# Patient Record
Sex: Female | Born: 1985 | Race: White | Hispanic: No | Marital: Single | Smoking: Never smoker
Health system: Southern US, Community
[De-identification: ages and names within clinical notes are randomized; demographics above are authoritative.]

---

## 2006-01-30 ENCOUNTER — Emergency Department (HOSPITAL_COMMUNITY): Admission: EM | Admit: 2006-01-30 | Discharge: 2006-01-30 | Payer: Self-pay | Admitting: Emergency Medicine
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
## 2008-07-07 ENCOUNTER — Emergency Department (HOSPITAL_COMMUNITY): Admission: EM | Admit: 2008-07-07 | Discharge: 2008-07-07 | Payer: Self-pay | Admitting: Emergency Medicine

## 2008-09-04 ENCOUNTER — Emergency Department (HOSPITAL_COMMUNITY): Admission: EM | Admit: 2008-09-04 | Discharge: 2008-09-04 | Payer: Self-pay | Admitting: Emergency Medicine

## 2008-09-05 ENCOUNTER — Emergency Department (HOSPITAL_COMMUNITY): Admission: EM | Admit: 2008-09-05 | Discharge: 2008-09-05 | Payer: Self-pay | Admitting: Emergency Medicine

## 2010-07-05 ENCOUNTER — Emergency Department (HOSPITAL_COMMUNITY)
Admission: EM | Admit: 2010-07-05 | Discharge: 2010-07-06 | Payer: Self-pay | Source: Home / Self Care | Admitting: Emergency Medicine

## 2010-10-14 LAB — BASIC METABOLIC PANEL
Chloride: 104 mEq/L (ref 96–112)
Creatinine, Ser: 0.7 mg/dL (ref 0.4–1.2)
GFR calc Af Amer: >60 mL/min (ref >60)
GFR calc non Af Amer: >60 mL/min (ref >60)
Glucose, Bld: 99 mg/dL (ref 70–99)

## 2010-10-14 LAB — CBC
HCT: 38.3 % (ref 36.0–46.0)
Hemoglobin: 12.8 g/dL (ref 12.0–15.0)
Platelets: 220 10*3/uL (ref 150–400)
RBC: 4.2 MIL/uL (ref 3.87–5.11)

## 2010-10-14 LAB — DIFFERENTIAL
Basophils Absolute: 0 10*3/uL (ref 0.0–0.1)
Neutro Abs: 18.7 10*3/uL — ABNORMAL HIGH (ref 1.7–7.7)

## 2010-10-14 LAB — RAPID STREP SCREEN (MED CTR MEBANE ONLY): Streptococcus, Group A Screen (Direct): POSITIVE — AB

## 2011-05-09 IMAGING — CR DG FOOT COMPLETE 3+V*R*
3 series · 3 of 3 positions shown · non-contrast
Comparison: None.

CLINICAL DATA: Splinter in heel, pain and swelling

RIGHT FOOT COMPLETE - 3+ VIEW

[t foot ap right]
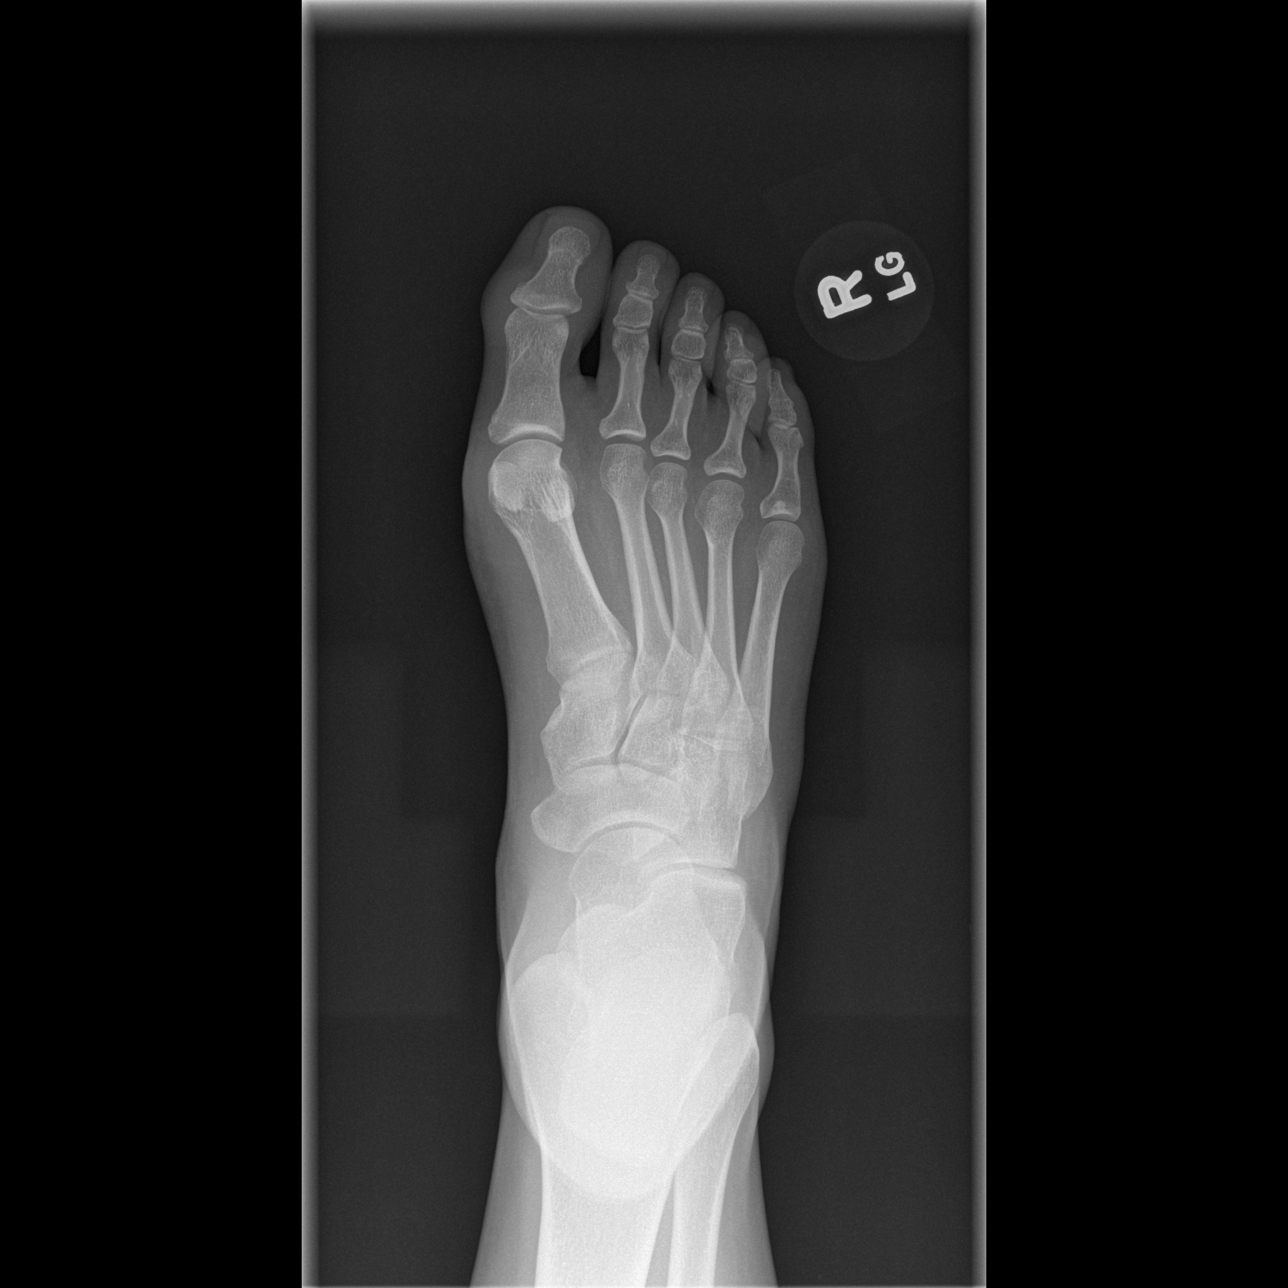

[t foot oblique right]
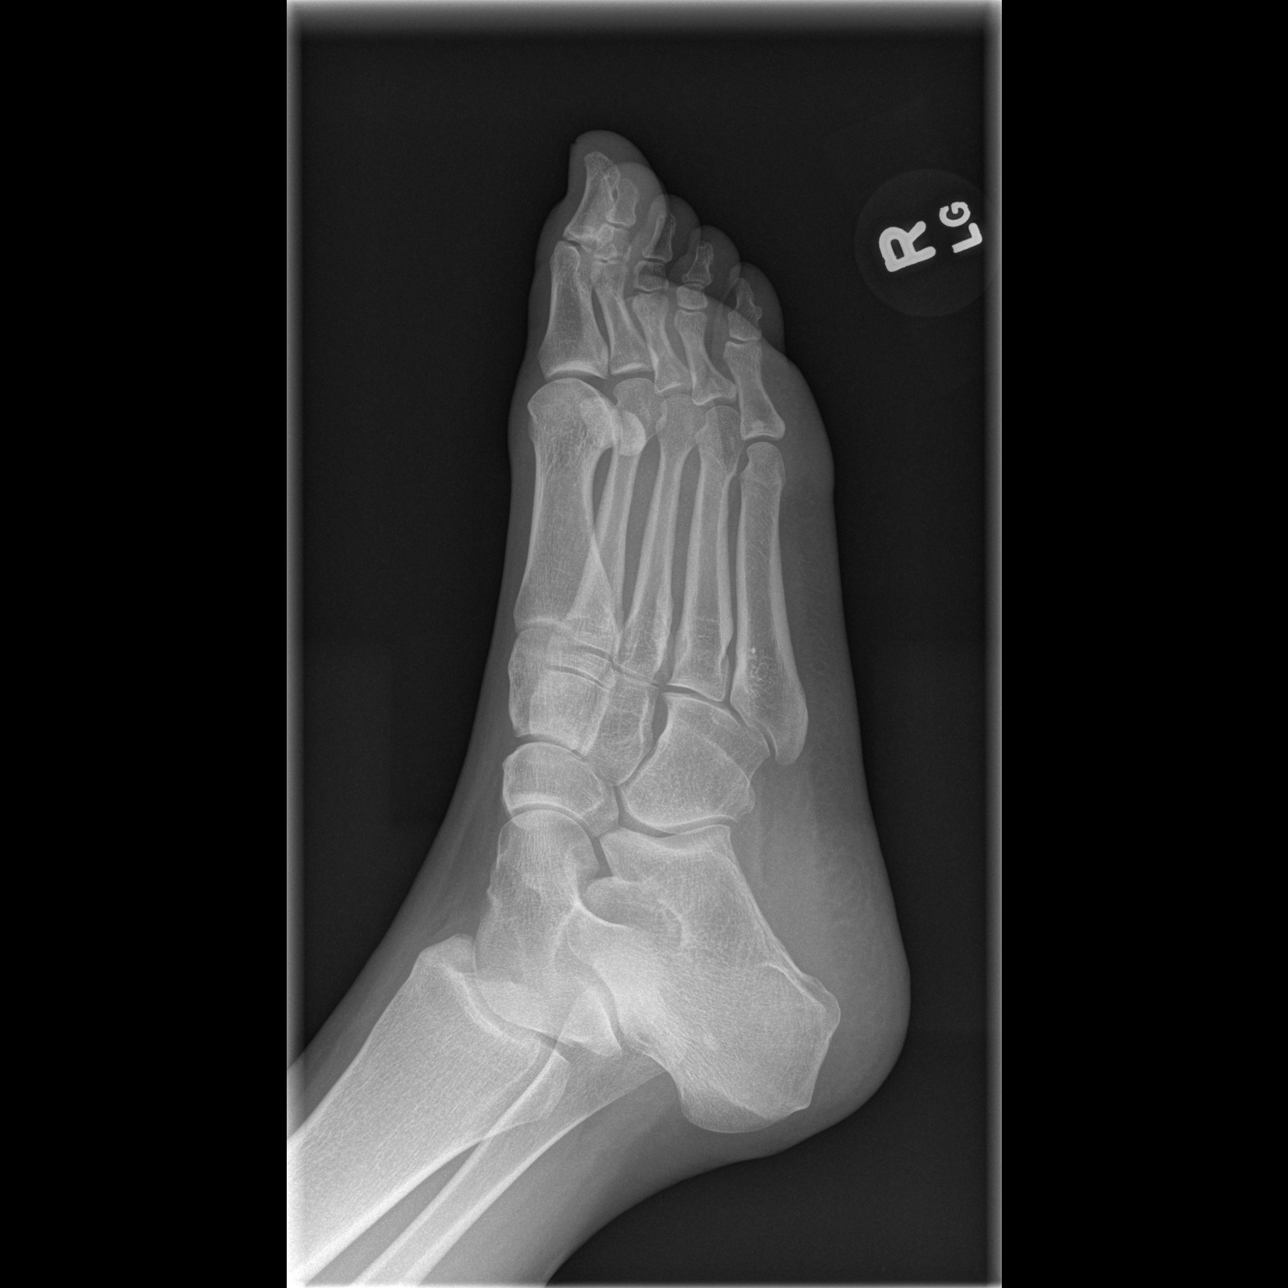

[t foot lat right]
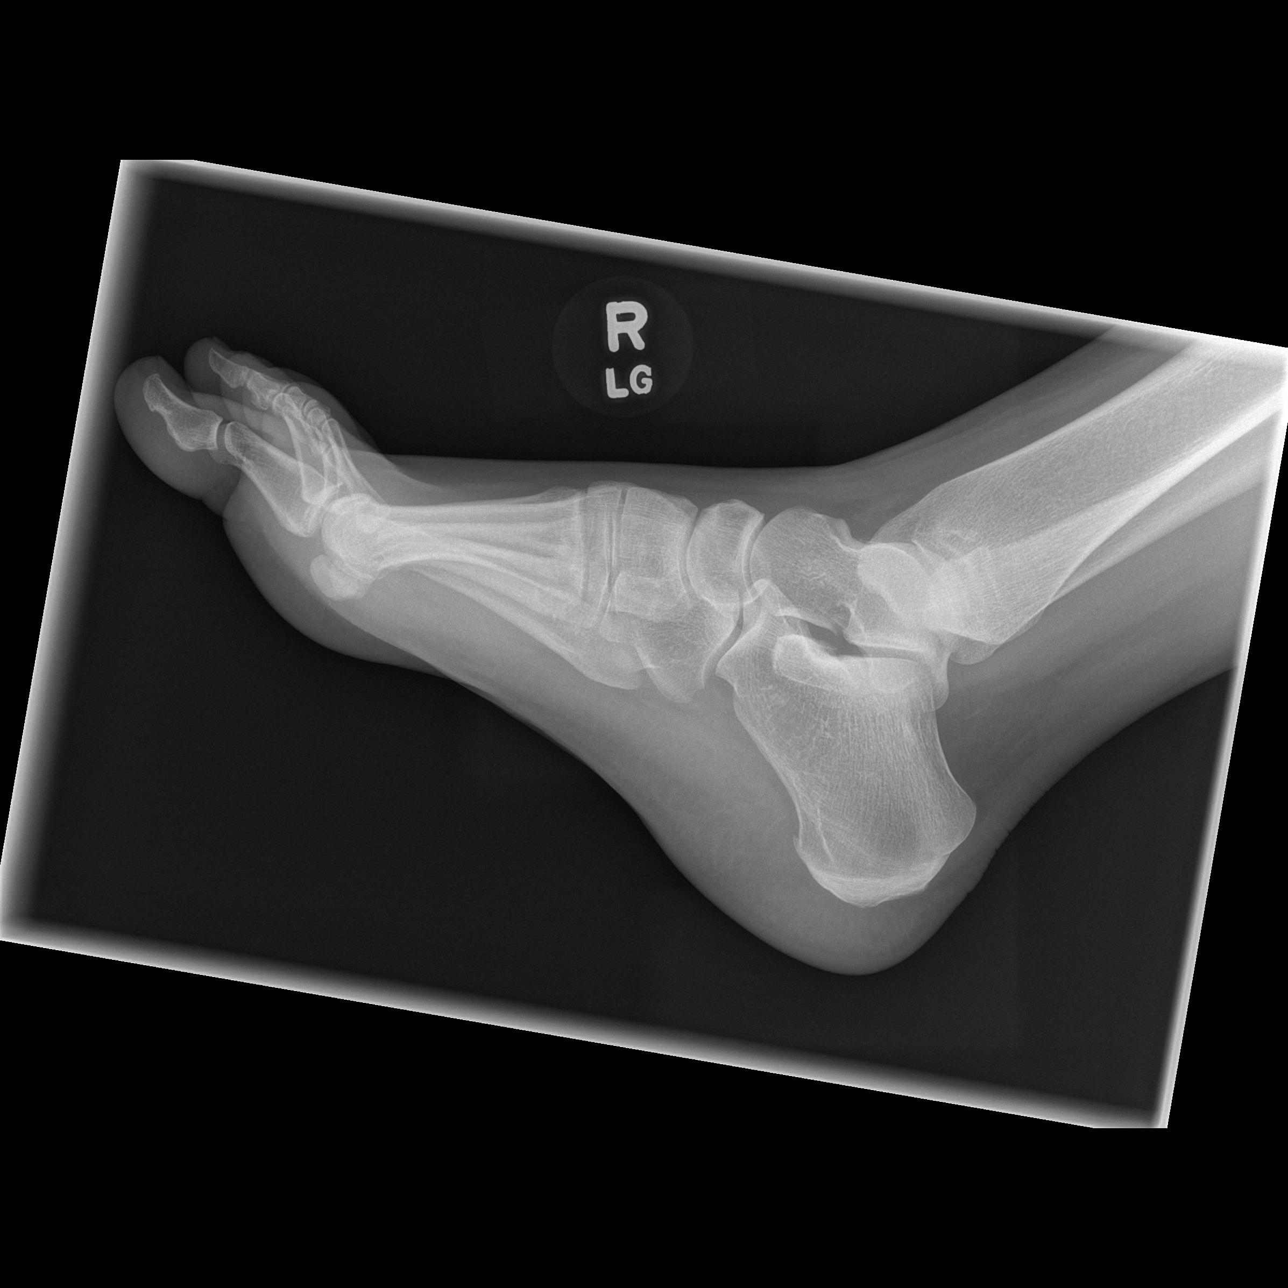

[3 of 3 positions shown; findings below may reference images not displayed]

FINDINGS: No fracture, dislocation, or osseous destruction.  No
radiopaque foreign body.  Possible edema is noted over the heel.
IMPRESSION: Possible soft tissue edema over the heel but no underlying osseous
abnormality or radiopaque foreign body.

## 2011-10-31 ENCOUNTER — Ambulatory Visit: Payer: Self-pay | Admitting: Gynecology

## 2011-11-10 ENCOUNTER — Ambulatory Visit (INDEPENDENT_AMBULATORY_CARE_PROVIDER_SITE_OTHER): Payer: PRIVATE HEALTH INSURANCE | Admitting: Gynecology

## 2011-11-10 ENCOUNTER — Encounter: Payer: Self-pay | Admitting: Gynecology

## 2011-11-10 VITALS — BP 104/70 | Ht 66.0 in | Wt 140.0 lb

## 2011-11-10 DIAGNOSIS — R87811 Vaginal high risk human papillomavirus (HPV) DNA test positive: Secondary | ICD-10-CM

## 2011-11-10 DIAGNOSIS — IMO0002 Reserved for concepts with insufficient information to code with codable children: Secondary | ICD-10-CM | POA: Insufficient documentation

## 2011-11-10 NOTE — Progress Notes (Signed)
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 Patient stated that when she was much younger she had an abnormal Pap smear but was followed up and whatever abnormality there was had cleared spontaneously. Patient currently on oral contraceptive pills and sexually active.  Patient underwent a detail colposcopic evaluation as follows: The external genitalia perineum and perirectal region were inspected thoroughly. A speculum was introduced into the vagina and a systematic inspection was undertaken of the vaginal walls, fornices, and cervix. Acetic acid was applied no acetowhite or leukoplakic lesions were noted. Endocervical speculum was utilized and the transformation zone was visualized entirely. An ECC was obtained and submitted for histological evaluation.  Assessment/plan: 26 year old with 2 prior ASCUS Pap smears with high-risk HPV. Negative colposcopy today. New guidelines as follows discussed with the patient and copy given to her:  ATYPICAL SQUAMOUS CELLS (ASC) -- ASC is subdivided into atypical squamous cells of undetermined significance (ASC-US) and atypical squamous cells, cannot rule out a high grade lesion (ASC-H). The risk of a high-grade precancerous lesion in women with ASC-US is 15 percent and for those with ASC-H, the risk is 38 percent Atypical squamous cells of undetermined significance (ASC-US) -- There are three options for evaluation of a single ASC-US result.  Perform HPV testing. This is the preferred follow up for ASC-US. HPV testing is often done at the same time as the Pap smear. Women who test positive for HPV types that are high risk for cervical cancer should have colposcopy because they are at greater risk of having an underlying  precancerous lesion. Women who test negative for HPV are not likely to have cervical precancer. These women should have a repeat Pap smear in one year. In most cases, the ASC-US resolves during this time.  Repeat the Pap smear in six months. If this test is normal, it is repeated once more after another six months until there have been two normal tests in a row; the woman can then return to routine screening. If the woman has a second ASC-US result or a more severe abnormality develops, colposcopy is recommended.  Patient was reminded to followup in 6 months for followup Pap smear. Will notify her there is any abnormality on the ECC that was obtained today.

## 2011-11-10 NOTE — Patient Instructions (Signed)
Patient information: Management of atypical squamous cells (ASC-US and ASC-H) and low grade cervical squamous intraepithelial lesions (LSIL) (Beyond the Basics)  Author Lanna Poche, MD Section Editor Alvera Novel, MD Deputy Editor Morton Amy, MD Disclosures  All topics are updated as new evidence becomes available and our peer review process is complete.  Literature review current through: Apr 2013.  This topic last updated: Aug 15, 2011.  INTRODUCTION -- The cervix and vagina are lined by cells called squamous cells (picture 1). Atypical squamous cells (ASC) is the name given to squamous cells on a Pap smear (also called cervical cytology) that do not have a normal appearance but are not clearly precancerous. Low grade squamous intraepithelial lesions (LSIL, also called low grade cervical intraepithelial neoplasia) refers to cells that appear slightly abnormal. Women who have ASC or LSIL on a Pap smear require further testing because some women with these findings have a precancerous lesion of the cervix. This topic review discusses the management of women with ASC and LSIL. The management of women with high grade squamous intraepithelial lesions (HSIL) and atypical glandular cells (AGC) are discussed in a separate topic review. (See "Patient information: Management of high grade cervical squamous intraepithelial lesions (HSIL) and glandular abnormalities (AGC) (Beyond the Basics)".) ATYPICAL SQUAMOUS CELLS (ASC) -- ASC is subdivided into atypical squamous cells of undetermined significance (ASC-US) and atypical squamous cells, cannot rule out a high grade lesion (ASC-H). The risk of a high-grade precancerous lesion in women with ASC-US is 15 percent and for those with ASC-H, the risk is 38 percent [1]. Cervical cancer screening is recommended starting at age 69 years. Management of abnormal Pap smears performed in women age 13 or younger is discussed below. (See 'Adolescents' below.)  Management of abnormal Pap smears in women who are pregnant is also discussed below. (See 'Pregnant women' below.) Atypical squamous cells of undetermined significance (ASC-US) -- There are three options for evaluation of a single ASC-US result.  Perform HPV testing. This is the preferred follow up for ASC-US. HPV testing is often done at the same time as the Pap smear. This is convenient because the woman does not have to return for a second visit. HPV testing is described in detail in a separate topic review (see "Patient information: Cervical cancer screening (Beyond the Basics)").  Women who test positive for HPV types that are high risk for cervical cancer should have colposcopy because they are at greater risk of having an underlying precancerous lesion.  Women who test negative for HPV are not likely to have cervical precancer. These women should have a repeat Pap smear in one year. In most cases, the ASC-US resolves during this time.  Repeat the Pap smear in six months. If this test is normal, it is repeated once more after another six months until there have been two normal tests in a row; the woman can then return to routine screening. If the woman has a second ASC-US result or a more severe abnormality develops, colposcopy is recommended. (See 'Colposcopy' below.)  Have colposcopy. (See 'Colposcopy' below.) Atypical squamous cells, cannot rule out a high grade lesion (ASC-H) -- ASC-H is more likely than ASC-US to be caused by a precancerous change. This finding requires further evaluation with colposcopy (see 'Colposcopy' below). LOW-GRADE SQUAMOUS LESION (LSIL) -- LSIL is usually caused by mild cellular changes. Further testing with colposcopy and cervical biopsy is almost always recommended for women with LSIL because 12 to 16 percent of women with LSIL have  a precancerous lesion [2,3]. The management of women with LSIL depends upon what is seen with colposcopy and biopsy (see 'Management  after colposcopy' below); most clinicians will delay biopsy until after delivery in pregnant women (see 'Pregnant women' below). Adolescents and are evaluated somewhat differently (see 'Adolescents' below).  COLPOSCOPY -- Colposcopy is an office procedure that allows a clinician to closely examine the cervix. It is commonly performed after an abnormal Pap smear. Colposcopy is performed similar to a pelvic examination, while the woman lies on an exam table. A speculum is used to view the cervix, and the viewing device (called a colposcope) remains outside the woman's body (picture 1). The colposcope magnifies the appearance of the cervix. This allows the clinician to better see the location and size of any abnormalities, and also to see any changes in the capillaries (small blood vessels) on the surface of the cervix.  During colposcopy, a small piece of the abnormal area can be removed (biopsied). Anesthesia (numbing medicine) is not needed because the biopsy causes only mild discomfort or cramping. Some women also need to have a biopsy of the inner cervix during colposcopy; this is called endocervical curettage (ECC). Endocervix refers to the inner cervix and curettage means scraping. Pregnant women should not have ECC because it may disturb the pregnancy. Management after colposcopy -- Most women who have a colposcopy have a biopsy of any abnormal-appearing areas. The biopsy samples are sent to a pathologist who determines if there is any evidence of precancerous changes, termed cervical intraepithelial neoplasia (CIN). These changes are categorized as being mild (CIN 1) or moderate to severe (CIN 2 or 3). CIN 1 biopsy in women with Pap smear results that were ASC-US, ASC-H or LSIL cytology - Follow-up is recommended with either HPV testing at 12 months or a Pap smear at 6 and 12 months. The reason for this recommendation is that CIN 1 is a minor abnormality that usually goes away over time without  treatment. Waiting and repeating testing allows time for the abnormality to resolve and also enables the healthcare provider to identify the few situations in which the abnormality has become more severe. Repeat colposcopy is recommended if the results of the follow-up Pap smear are ASC or greater or if the HPV test is positive. Women with two consecutive negative repeat cytology results or a negative HPV test can resume routine screening.  CIN 1 biopsy in women with Pap smear results that were high-grade SIL (HSIL) or atypical glandular cells-not otherwise specified - Follow-up can be one of three options: (1) Pap smear and colposcopy every six months for a year; (2) re-review of both Pap smear and biopsy results by a pathologist; or (3) a procedure to remove a larger piece of tissue from the cervix (cone biopsy or loop electrosurgical excision procedure [called LEEP, loop, or LLETZ]).  CIN 2 or 3 -- CIN 2 or 3 is usually treated by removing or destroying the abnormal area (using a cone biopsy, LEEP, laser, or freezing procedure [cryotherapy]). The reason for this recommendation is that moderate to severe precancerous abnormalities (CIN 2 or 3) are unlikely to resolve over time without treatment and may progress to cancer if left untreated over a period of years. (See "Patient information: Treatment of precancerous cells of the cervix (Beyond the Basics)".) However, in some cases, treatment can be delayed in adolescents or pregnant women

## 2012-03-15 ENCOUNTER — Encounter: Payer: Self-pay | Admitting: Gynecology

## 2012-03-15 ENCOUNTER — Ambulatory Visit (INDEPENDENT_AMBULATORY_CARE_PROVIDER_SITE_OTHER): Payer: BC Managed Care – PPO | Admitting: Gynecology

## 2012-03-15 VITALS — BP 108/70

## 2012-03-15 DIAGNOSIS — N898 Other specified noninflammatory disorders of vagina: Secondary | ICD-10-CM

## 2012-03-15 DIAGNOSIS — A499 Bacterial infection, unspecified: Secondary | ICD-10-CM

## 2012-03-15 DIAGNOSIS — Z113 Encounter for screening for infections with a predominantly sexual mode of transmission: Secondary | ICD-10-CM

## 2012-03-15 DIAGNOSIS — N760 Acute vaginitis: Secondary | ICD-10-CM

## 2012-03-15 LAB — WET PREP FOR TRICH, YEAST, CLUE: Trich, Wet Prep: NONE SEEN

## 2012-03-15 MED ORDER — METRONIDAZOLE 0.75 % VA GEL: 1.0000 | Freq: Two times a day (BID) | VAGINAL | Status: AC

## 2012-03-15 NOTE — Patient Instructions (Signed)
Bacterial Vaginosis Bacterial vaginosis (BV) is a vaginal infection where the normal balance of bacteria in the vagina is disrupted. The normal balance is then replaced by an overgrowth of certain bacteria. There are several different kinds of bacteria that can cause BV. BV is the most common vaginal infection in women of childbearing age. CAUSES   The cause of BV is not fully understood. BV develops when there is an increase or imbalance of harmful bacteria.   Some activities or behaviors can upset the normal balance of bacteria in the vagina and put women at increased risk including:   Having a new sex partner or multiple sex partners.   Douching.   Using an intrauterine device (IUD) for contraception.   It is not clear what role sexual activity plays in the development of BV. However, women that have never had sexual intercourse are rarely infected with BV.  Women do not get BV from toilet seats, bedding, swimming pools or from touching objects around them.  SYMPTOMS   Grey vaginal discharge.   A fish-like odor with discharge, especially after sexual intercourse.   Itching or burning of the vagina and vulva.   Burning or pain with urination.   Some women have no signs or symptoms at all.  DIAGNOSIS  Your caregiver must examine the vagina for signs of BV. Your caregiver will perform lab tests and look at the sample of vaginal fluid through a microscope. They will look for bacteria and abnormal cells (clue cells), a pH test higher than 4.5, and a positive amine test all associated with BV.  RISKS AND COMPLICATIONS   Pelvic inflammatory disease (PID).   Infections following gynecology surgery.   Developing HIV.   Developing herpes virus.  TREATMENT  Sometimes BV will clear up without treatment. However, all women with symptoms of BV should be treated to avoid complications, especially if gynecology surgery is planned. Female partners generally do not need to be treated. However,  BV may spread between female sex partners so treatment is helpful in preventing a recurrence of BV.   BV may be treated with antibiotics. The antibiotics come in either pill or vaginal cream forms. Either can be used with nonpregnant or pregnant women, but the recommended dosages differ. These antibiotics are not harmful to the baby.   BV can recur after treatment. If this happens, a second round of antibiotics will often be prescribed.   Treatment is important for pregnant women. If not treated, BV can cause a premature delivery, especially for a pregnant woman who had a premature birth in the past. All pregnant women who have symptoms of BV should be checked and treated.   For chronic reoccurrence of BV, treatment with a type of prescribed gel vaginally twice a week is helpful.  HOME CARE INSTRUCTIONS   Finish all medication as directed by your caregiver.   Do not have sex until treatment is completed.   Tell your sexual partner that you have a vaginal infection. They should see their caregiver and be treated if they have problems, such as a mild rash or itching.   Practice safe sex. Use condoms. Only have 1 sex partner.  PREVENTION  Basic prevention steps can help reduce the risk of upsetting the natural balance of bacteria in the vagina and developing BV:  Do not have sexual intercourse (be abstinent).   Do not douche.   Use all of the medicine prescribed for treatment of BV, even if the signs and symptoms go away.     Tell your sex partner if you have BV. That way, they can be treated, if needed, to prevent reoccurrence.  SEEK MEDICAL CARE IF:   Your symptoms are not improving after 3 days of treatment.   You have increased discharge, pain, or fever.  MAKE SURE YOU:   Understand these instructions.   Will watch your condition.   Will get help right away if you are not doing well or get worse.  FOR MORE INFORMATION  Division of STD Prevention (DSTDP), Centers for Disease  Control and Prevention: www.cdc.gov/std American Social Health Association (ASHA): www.ashastd.org  Document Released: 06/20/2005 Document Revised: 06/09/2011 Document Reviewed: 12/11/2008 Exit[employer] Patient Information 2012 [employer]. 

## 2012-03-15 NOTE — Progress Notes (Signed)
Patient is a 26 year old who was seen in the office in may of this year for her annual gynecological examination see previous notes. She's here today because of a vaginal discharge with odor which she states she has had for 2 weeks. She's using the NuvaRing for contraception is having normal menstrual cycles and is sexually active. She denies any dysuria or frequency.  Exam: Pelvic: Bartholin urethra Skene was within normal limits Vagina: White 5 odor discharge no lesions seen Cervix: No lesion or discharge Uterus: Not examined Adnexa: Not examined Rectal exam: Not examined  Wet prep demonstrated too numerous to count bacteria moderate amount of clue  cells and positive amine   Assessment/plan: Patient with clinical evidence of bacterial vaginosis. She will be treated with MetroGel vaginal cream to apply twice a day for 5 days and to abstain from intercourse during this 5 days. GC and Chlamydia culture obtained results pending at time of this dictation. 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 We discussed that some individual have a recurrence rate of bacterial vaginosis and may need 6 months treatment. Patient would like to be treated this time and began to use probiotic vaginal gel called refresh to apply twice a week and or take the refresh a probiotic tablet daily. If this continues after these measures she will return to the office so that we may proceed with a six-month regimen. Patient otherwise scheduled to return back in May of next year for her annual exam and followup Pap smear.

## 2012-03-16 LAB — GC/CHLAMYDIA PROBE AMP, GENITAL
Chlamydia, DNA Probe: NEGATIVE
GC Probe Amp, Genital: NEGATIVE

## 2012-05-08 ENCOUNTER — Emergency Department
Admission: EM | Admit: 2012-05-08 | Discharge: 2012-05-08 | Discharge status: Absent without leave | Payer: BC Managed Care – PPO | Source: Home / Self Care

## 2012-10-30 ENCOUNTER — Other Ambulatory Visit: Payer: Self-pay | Admitting: Dermatology

## 2015-10-30 ENCOUNTER — Emergency Department (HOSPITAL_COMMUNITY)
Admission: EM | Admit: 2015-10-30 | Discharge: 2015-10-30 | Disposition: A | Discharge status: Home / Self Care | Payer: 59 | Attending: Emergency Medicine | Admitting: Emergency Medicine

## 2015-10-30 ENCOUNTER — Encounter (HOSPITAL_COMMUNITY): Payer: Self-pay

## 2015-10-30 DIAGNOSIS — Z79899 Other long term (current) drug therapy: Secondary | ICD-10-CM | POA: Diagnosis not present

## 2015-10-30 DIAGNOSIS — M542 Cervicalgia: Secondary | ICD-10-CM | POA: Diagnosis present

## 2015-10-30 DIAGNOSIS — M62838 Other muscle spasm: Secondary | ICD-10-CM | POA: Diagnosis not present

## 2015-10-30 DIAGNOSIS — Z79891 Long term (current) use of opiate analgesic: Secondary | ICD-10-CM | POA: Insufficient documentation

## 2015-10-30 MED ORDER — MORPHINE SULFATE (PF) 4 MG/ML IV SOLN
6.0000 mg | Freq: Once | INTRAVENOUS | Status: DC
  Filled 2015-10-30: qty 2

## 2015-10-30 MED ORDER — KETOROLAC TROMETHAMINE 60 MG/2ML IM SOLN
60.0000 mg | Freq: Once | INTRAMUSCULAR | Status: AC
  Administered 2015-10-30: 60 mg via INTRAMUSCULAR
  Filled 2015-10-30: qty 2

## 2015-10-30 MED ORDER — DIAZEPAM 2 MG PO TABS
2.0000 mg | ORAL_TABLET | Freq: Once | ORAL | Status: AC
  Administered 2015-10-30: 2 mg via ORAL
  Filled 2015-10-30: qty 1

## 2015-10-30 MED ORDER — HYDROCODONE-ACETAMINOPHEN 5-325 MG PO TABS: 1.0000 | ORAL_TABLET | Freq: Once | ORAL | Status: AC

## 2015-10-30 MED ORDER — HYDROCODONE-ACETAMINOPHEN 5-325 MG PO TABS
1.0000 | ORAL_TABLET | Freq: Once | ORAL | Status: AC
  Administered 2015-10-30: 1 via ORAL
  Filled 2015-10-30: qty 1

## 2015-10-30 MED ORDER — DIAZEPAM 2 MG PO TABS: 2.0000 mg | ORAL_TABLET | Freq: Four times a day (QID) | ORAL | Status: AC | PRN

## 2015-10-30 MED ORDER — OXYCODONE-ACETAMINOPHEN 5-325 MG PO TABS
1.0000 | ORAL_TABLET | Freq: Once | ORAL | Status: AC
  Administered 2015-10-30: 1 via ORAL
  Filled 2015-10-30: qty 1

## 2015-10-30 NOTE — ED Provider Notes (Signed)
CSN: [id number]     Arrival date & time 10/30/15  [id number] History   First MD Initiated Contact with Patient 10/30/15 [phone]     Chief Complaint  Patient presents with  . Neck Pain     (Consider location/radiation/quality/duration/timing/severity/associated sxs/prior Treatment) Patient is a 30 y.o. female presenting with back pain.  Back Pain Pain location: right paraspinal. Quality:  Aching, stiffness and shooting Radiates to: outwards bilaterally. Pain severity:  Mild Onset quality:  Unable to specify Duration:  3 hours Timing:  Constant Progression:  Worsening Chronicity:  New Context: not emotional stress, not MCA and not MVA   Relieved by:  None tried Worsened by:  Nothing tried Ineffective treatments:  None tried Associated symptoms: no abdominal pain, no dysuria, no fever, no leg pain, no pelvic pain and no perianal numbness     History reviewed. No pertinent past medical history. History reviewed. No pertinent past surgical history. Family History  Problem Relation Age of Onset  . Hypertension Mother   . Diabetes Mother    Social History  Substance Use Topics  . Smoking status: Never Smoker   . Smokeless tobacco: Never Used  . Alcohol Use: Yes     Comment: rare   OB History    Gravida Para Term Preterm AB TAB SAB Ectopic Multiple Living   0              Review of Systems  Constitutional: Negative for fever.  Eyes: Negative for photophobia and pain.  Respiratory: Negative for cough and shortness of breath.   Gastrointestinal: Negative for nausea and abdominal pain.  Endocrine: Negative for polydipsia and polyuria.  Genitourinary: Negative for dysuria, enuresis and pelvic pain.  Musculoskeletal: Positive for myalgias, back pain and neck pain.  Skin: Negative for rash and wound.  All other systems reviewed and are negative.     Allergies  Review of patient's allergies indicates no known allergies.  Home Medications   Prior to Admission medications    Medication Sig Start Date End Date Taking? Authorizing Provider  Multiple Vitamin (MULTIVITAMIN WITH MINERALS) TABS tablet Take 1 tablet by mouth daily.   Yes Historical Provider, MD  diazepam (VALIUM) 2 MG tablet Take 1 tablet (2 mg total) by mouth every 6 (six) hours as needed for anxiety. 10/30/15   Marily MemosJason Lamekia Nolden, MD  HYDROcodone-acetaminophen (NORCO/VICODIN) 5-325 MG tablet Take 1-2 tablets by mouth once. 10/30/15   Marily MemosJason Kiron Osmun, MD   BP 96/61 mmHg  Pulse 57  Temp(Src) 98.2 F (36.8 C) (Oral)  Resp 12  Ht 5\' 6"  (1.676 m)  Wt 150 lb (68.04 kg)  BMI 24.22 kg/m2  SpO2 99%  LMP 09/29/2015 (Approximate) Physical Exam  Constitutional: She is oriented to person, place, and time. She appears well-developed and well-nourished.  HENT:  Head: Normocephalic and atraumatic.  Neck: Normal range of motion.  Cardiovascular: Normal rate and regular rhythm.   Pulmonary/Chest: Effort normal. No stridor. No respiratory distress. She has no wheezes. She has no rales.  Abdominal: Soft. She exhibits no distension. There is no tenderness.  Musculoskeletal: She exhibits tenderness (around T3/T4 paraspinal on the right.). She exhibits no edema.  Neurological: She is alert and oriented to person, place, and time.  Skin: Skin is warm and dry. No erythema.  Nursing note and vitals reviewed.   ED Course  Procedures (including critical care time) Labs Review Labs Reviewed - No data to display  Imaging Review No results found. I have personally reviewed and evaluated these images and  lab results as part of my medical decision-making.   EKG Interpretation None      MDM   Final diagnoses:  Neck muscle spasm   Likely MSK spasm. Doubt thoracic bony injury. No fever/infectious symptms to suggest abscess or meningitis.  Will tx for spasm.  On reevaluation, patient states pain still present however she is moving her head more freely, looking up/down, left/right without as much pain as previously. Will  continue to monitor.   Continued improvement. patient refusing further pain medication and thus is stable for dc.   New Prescriptions: Discharge Medication List as of 10/30/2015 10:49 AM    START taking these medications   Details  diazepam (VALIUM) 2 MG tablet Take 1 tablet (2 mg total) by mouth every 6 (six) hours as needed for anxiety., Starting 10/30/2015, Until Discontinued, Print    HYDROcodone-acetaminophen (NORCO/VICODIN) 5-325 MG tablet Take 1-2 tablets by mouth once., Starting 10/30/2015, Print         I have personally and contemperaneously reviewed labs and imaging and used in my decision making as above.   A medical screening exam was performed and I feel the patient has had an appropriate workup for their chief complaint at this time and likelihood of emergent condition existing is low and thus workup can continue on an outpatient basis.. Their vital signs are stable. They have been counseled on decision, discharge, follow up and which symptoms necessitate immediate return to the emergency department.  They verbally stated understanding and agreement with plan and discharged in stable condition.      Marily Memos, MD 10/31/15 [phone]

## 2015-10-30 NOTE — ED Notes (Signed)
Bed: WA04 Expected date:  Expected time:  Means of arrival:  Comments: 
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
## 2015-11-03 ENCOUNTER — Emergency Department (HOSPITAL_COMMUNITY): Payer: 59

## 2015-11-03 ENCOUNTER — Emergency Department (HOSPITAL_COMMUNITY)
Admission: EM | Admit: 2015-11-03 | Discharge: 2015-11-03 | Disposition: A | Discharge status: Home / Self Care | Payer: 59 | Attending: Emergency Medicine | Admitting: Emergency Medicine

## 2015-11-03 ENCOUNTER — Encounter (HOSPITAL_COMMUNITY): Payer: Self-pay

## 2015-11-03 DIAGNOSIS — Z79899 Other long term (current) drug therapy: Secondary | ICD-10-CM | POA: Insufficient documentation

## 2015-11-03 DIAGNOSIS — M542 Cervicalgia: Secondary | ICD-10-CM | POA: Diagnosis present

## 2015-11-03 DIAGNOSIS — R202 Paresthesia of skin: Secondary | ICD-10-CM | POA: Diagnosis not present

## 2015-11-03 MED ORDER — CYCLOBENZAPRINE HCL 10 MG PO TABS: 10.0000 mg | ORAL_TABLET | Freq: Two times a day (BID) | ORAL | Status: AC | PRN

## 2015-11-03 NOTE — ED Notes (Addendum)
Patient here with ongoing neck pain since Thursday. Has taken muscle relaxers with no relief and some numbness and tingling in hands. No known trama. Patient reports that her neck xray is abnormal and she was sent from her doctor to further evaluation. No arm drift noted, steady gait and ambulatory

## 2015-11-03 NOTE — ED Provider Notes (Signed)
CSN: [id number]     Arrival date & time 11/03/15  1540 History   First MD Initiated Contact with Patient 11/03/15 2028     Chief Complaint  Patient presents with  . neck injury/from UCC/paraesthesis     HPI   30 year old female presents today with complaints of neck pain. She reports that she woke up 5 days ago with lower cervical neck pain. She notes that the day before she was working out at [employer], but denies any specific injury. She reports pain with both flexion and extension of the neck, and noted in her mid paresthesia of the bilateral hands. Patient reports that the symptoms were present yesterday, none today. She notes that she initially was seen in the emergency room diagnosed with muscle spasm was given Ativan, pain medication and discharged home. She reports these did not improve her symptoms, she followed up at urgent care today where she received an x-ray. She reports that the x-ray had some abnormal finding and that she needed to come the emergency room for an MRI. Patient denies any fever, headaches, neurological deficits other than those noted above, loss of sensation strength or motor function. No history of the same.   History reviewed. No pertinent past medical history. History reviewed. No pertinent past surgical history. Family History  Problem Relation Age of Onset  . Hypertension Mother   . Diabetes Mother    Social History  Substance Use Topics  . Smoking status: Never Smoker   . Smokeless tobacco: Never Used  . Alcohol Use: Yes     Comment: rare   OB History    Gravida Para Term Preterm AB TAB SAB Ectopic Multiple Living   0              Review of Systems  All other systems reviewed and are negative.   Allergies  Review of patient's allergies indicates no known allergies.  Home Medications   Prior to Admission medications   Medication Sig Start Date End Date Taking? Authorizing Provider  diazepam (VALIUM) 2 MG tablet Take 1 tablet (2 mg total) by  mouth every 6 (six) hours as needed for anxiety. 10/30/15  Yes Marily Memos, MD  Melatonin 3 MG CAPS Take 1 capsule by mouth at bedtime as needed (for sleep).   Yes Historical Provider, MD  Multiple Vitamin (MULTIVITAMIN WITH MINERALS) TABS tablet Take 1 tablet by mouth daily.   Yes Historical Provider, MD  cyclobenzaprine (FLEXERIL) 10 MG tablet Take 1 tablet (10 mg total) by mouth 2 (two) times daily as needed for muscle spasms. 11/03/15   Eyvonne Mechanic, PA-C  HYDROcodone-acetaminophen (NORCO/VICODIN) 5-325 MG tablet Take 1-2 tablets by mouth once. 10/30/15   Marily Memos, MD   BP 106/82 mmHg  Pulse 58  Temp(Src) 98.4 F (36.9 C) (Oral)  Resp 16  SpO2 100%  LMP 09/29/2015 (Approximate)   Physical Exam  Constitutional: She is oriented to person, place, and time. She appears well-developed and well-nourished.  HENT:  Head: Normocephalic and atraumatic.  Eyes: Conjunctivae are normal. Pupils are equal, round, and reactive to light. Right eye exhibits no discharge. Left eye exhibits no discharge. No scleral icterus.  Neck: Normal range of motion. No JVD present. No tracheal deviation present.  Pulmonary/Chest: Effort normal. No stridor.  Musculoskeletal:  Tenderness to palpation of the lower cervical spine and surrounding musculature, no signs of infection, no warmth, redness, swelling or edema. Patient has reduced range of motion of the neck due to pain  Neurological:  She is alert and oriented to person, place, and time. Coordination normal.  Skin: Skin is warm and dry. No rash noted. No erythema. No pallor.  Psychiatric: She has a normal mood and affect. Her behavior is normal. Judgment and thought content normal.  Nursing note and vitals reviewed.   ED Course  Procedures (including critical care time) Labs Review Labs Reviewed - No data to display  Imaging Review Mr Cervical Spine Wo Contrast  11/03/2015  CLINICAL DATA:  Initial evaluation for acute neck pain for several days,  paresthesias in bilateral hands. No known injury. EXAM: MRI CERVICAL SPINE WITHOUT CONTRAST TECHNIQUE: Multiplanar, multisequence MR imaging of the cervical spine was performed. No intravenous contrast was administered. COMPARISON:  None. FINDINGS: Visualized portions of the brain and posterior fossa are normal in appearance. Craniocervical junction widely patent and normal. No Chiari malformation. Reversal of the normal cervical lordosis with apex at C4-5. No listhesis. Vertebral body heights maintained. Signal intensity within the vertebral body bone marrow is normal. No made of a probable small benign hemangioma within the T2 vertebral body. No marrow edema. Signal intensity within the cervical spinal cord is normal. Paraspinous soft tissues within normal limits. No prevertebral edema. Normal intravascular flow voids present within the vertebral arteries bilaterally. C2-C3: Negative. C3-C4:  Negative. C4-C5: Shallow broad base disc protrusion minimally indents the ventral thecal sac without significant canal stenosis. Foramina are widely patent. C5-C6: Shallow central disc protrusion minimally indents the ventral thecal sac without significant canal stenosis. Foramina are widely patent. C6-C7: Tiny central disc protrusion minimally indents the ventral thecal sac without significant stenosis. Foramina are widely patent. C7-T1:  Negative. Visualized portions of the upper thoracic spine are unremarkable. IMPRESSION: 1. Reversal of the normal cervical lordosis, which may be related to patient positioning or muscular spasm. 2. Shallow central disc protrusions at C4-5, C5-6, and C6-7 without significant stenosis. No other significant degenerative changes within the cervical spine. No canal or foraminal stenosis. Electronically Signed   By: Rise MuBenjamin  McClintock M.D.   On: 11/03/2015 21:49   I have personally reviewed and evaluated these images and lab results as part of my medical decision-making.   EKG  Interpretation None      MDM   Final diagnoses:  Neck pain    Labs:   Imaging: MRI without contrast  Consults:  Therapeutics:  Discharge Meds:   Assessment/Plan: 30 year old female presents today with likely muscle spasm of her neck. Patient had plain films that showed reversal of normal cervical lordosis. Patient had paresthesias in the upper extremities with recommendations from previous M.D. for MRI study. Patient received an MRI here in the ED that showed no significant emergent findings that would require further evaluation or management here in the ED setting. Patient has no infectious etiology that would indicate meningitis or infection, no history of trauma, Patient will be instructed to use Flexeril, ibuprofen, heat and massage. She is given orthopedic follow-up information in the event that symptoms continue to persist. Patient is given strict return precautions, she verbalized understanding and agreement to today's plan had no further questions or concerns at time of discharge.        Eyvonne MechanicJeffrey Aarit Kashuba, PA-C 11/04/15 0132  Arby BarretteMarcy Pfeiffer, MD 11/12/15 1037
